# Patient Record
Sex: Male | Born: 1965 | Race: White | Hispanic: No | Marital: Married | Smoking: Never smoker
Health system: Southern US, Community
[De-identification: ages and names within clinical notes are randomized; demographics above are authoritative.]

## PROBLEM LIST (undated history)

## (undated) DIAGNOSIS — I10 Essential (primary) hypertension: Secondary | ICD-10-CM

## (undated) HISTORY — PX: OTHER SURGICAL HISTORY: SHX169

---

## 2008-07-15 ENCOUNTER — Ambulatory Visit: Payer: Self-pay | Admitting: Internal Medicine

## 2012-10-07 ENCOUNTER — Ambulatory Visit: Payer: Self-pay | Admitting: Family Medicine
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
## 2016-09-28 ENCOUNTER — Ambulatory Visit
Admission: EM | Admit: 2016-09-28 | Discharge: 2016-09-28 | Disposition: A | Discharge status: Home / Self Care | Payer: BLUE CROSS/BLUE SHIELD | Attending: Family Medicine | Admitting: Family Medicine

## 2016-09-28 DIAGNOSIS — H6502 Acute serous otitis media, left ear: Secondary | ICD-10-CM | POA: Diagnosis not present

## 2016-09-28 DIAGNOSIS — H9202 Otalgia, left ear: Secondary | ICD-10-CM | POA: Diagnosis not present

## 2016-09-28 HISTORY — DX: Essential (primary) hypertension: I10

## 2016-09-28 MED ORDER — AMOXICILLIN 875 MG PO TABS: 875.0000 mg | ORAL_TABLET | Freq: Two times a day (BID) | ORAL | 0 refills | Status: DC

## 2016-09-28 NOTE — ED Triage Notes (Signed)
As per patient Left side ear infection onset 3 days.

## 2016-09-28 NOTE — ED Provider Notes (Signed)
[facility]    CSN: [id number] Arrival date & time: 09/28/16  1208     History   Chief Complaint Chief Complaint  Patient presents with  . Otalgia    left side    HPI Ryan Chavez is a 51 y.o. male.    Otalgia  Location:  Left Quality:  Aching and pressure Severity:  Moderate Onset quality:  Sudden Duration:  3 days Timing:  Constant Progression:  Worsening Chronicity:  New Context: elevation change   Context: not direct blow, not foreign body in ear, not loud noise, not recent URI and not water in ear   Relieved by:  OTC medications Associated symptoms: no abdominal pain, no congestion, no cough, no diarrhea, no ear discharge, no fever, no headaches, no hearing loss, no neck pain, no rash, no rhinorrhea, no sore throat, no tinnitus and no vomiting   Risk factors: recent travel   Risk factors: no chronic ear infection and no prior ear surgery     Past Medical History:  Diagnosis Date  . Hypertension     There are no active problems to display for this patient.   Past Surgical History:  Procedure Laterality Date  .  fusion     C-5 and C-6       Home Medications    Prior to Admission medications   Medication Sig Start Date End Date Taking? Authorizing Provider  amLODipine (NORVASC) 5 MG tablet Take 5 mg by mouth daily.   Yes [provider]  amoxicillin (AMOXIL) 875 MG tablet Take 1 tablet (875 mg total) by mouth 2 (two) times daily. 09/28/16   Payton Mccallumonty, Ahmani Prehn, MD    Family History No family history on file.  Social History Social History  Substance Use Topics  . Smoking status: Never Smoker  . Smokeless tobacco: Never Used  . Alcohol use Yes     Allergies   Patient has no known allergies.   Review of Systems Review of Systems  Constitutional: Negative for fever.  HENT: Positive for ear pain. Negative for congestion, ear discharge, hearing loss, rhinorrhea, sore throat and tinnitus.   Respiratory: Negative for cough.     Gastrointestinal: Negative for abdominal pain, diarrhea and vomiting.  Musculoskeletal: Negative for neck pain.  Skin: Negative for rash.  Neurological: Negative for headaches.     Physical Exam Triage Vital Signs ED Triage Vitals  Enc Vitals Group     BP 09/28/16 1247 133/75     Pulse Rate 09/28/16 1247 61     Resp 09/28/16 1247 16     Temp 09/28/16 1247 98.1 F (36.7 C)     Temp Source 09/28/16 1247 Oral     SpO2 09/28/16 1247 99 %     Weight 09/28/16 1249 226 lb (102.5 kg)     Height 09/28/16 1249 5\' 11"  (1.803 m)     Head Circumference --      Peak Flow --      Pain Score 09/28/16 1244 0     Pain Loc --      Pain Edu? --      Excl. in GC? --    No data found.   Updated Vital Signs BP 133/75 (BP Location: Left Arm)   Pulse 61   Temp 98.1 F (36.7 C) (Oral)   Resp 16   Ht 5\' 11"  (1.803 m)   Wt 226 lb (102.5 kg)   SpO2 99%   BMI 31.52 kg/m   Visual Acuity Right  Eye Distance:   Left Eye Distance:   Bilateral Distance:    Right Eye Near:   Left Eye Near:    Bilateral Near:     Physical Exam  Constitutional: He appears well-developed and well-nourished. No distress.  HENT:  Right Ear: Tympanic membrane and ear canal normal.  Left Ear: Ear canal normal. Tympanic membrane is erythematous and bulging. A middle ear effusion is present.  Mouth/Throat: Oropharynx is clear and moist.  Skin: He is not diaphoretic.  Nursing note and vitals reviewed.    UC Treatments / Results  Labs (all labs ordered are listed, but only abnormal results are displayed) Labs Reviewed - No data to display  EKG  EKG Interpretation None       Radiology No results found.  Procedures Procedures (including critical care time)  Medications Ordered in UC Medications - No data to display   Initial Impression / Assessment and Plan / UC Course  I have reviewed the triage vital signs and the nursing notes.  Pertinent labs & imaging results that were available during my  care of the patient were reviewed by me and considered in my medical decision making (see chart for details).       Final Clinical Impressions(s) / UC Diagnoses   Final diagnoses:  Acute serous otitis media of left ear, recurrence not specified    New Prescriptions Discharge Medication List as of 09/28/2016  1:01 PM    START taking these medications   Details  amoxicillin (AMOXIL) 875 MG tablet Take 1 tablet (875 mg total) by mouth 2 (two) times daily., Starting Sat 09/28/2016, Normal       1. diagnosis reviewed with patient 2. rx as per orders above; reviewed possible side effects, interactions, risks and benefits  3. Recommend supportive treatment with otc analgesics prn 4. Follow-up prn if symptoms worsen or don't improve   Payton Mccallumonty, Salote Weidmann, MD 09/28/16 1336

## 2016-10-03 ENCOUNTER — Telehealth: Payer: Self-pay

## 2016-10-03 NOTE — Telephone Encounter (Signed)
Pt called and mentioned he wasn't getting any real relief with Amox that is treating an ear infection. This is day 5 of the medication. I encouraged patient to continue medication for the full 10 days, however if the pain intensifies, drainage from the ear, or any other concerns he should be seen by a provider.

## 2016-10-16 ENCOUNTER — Ambulatory Visit
Admission: EM | Admit: 2016-10-16 | Discharge: 2016-10-16 | Disposition: A | Discharge status: Home / Self Care | Payer: BLUE CROSS/BLUE SHIELD | Attending: Family Medicine | Admitting: Family Medicine

## 2016-10-16 DIAGNOSIS — H6692 Otitis media, unspecified, left ear: Secondary | ICD-10-CM | POA: Diagnosis not present

## 2016-10-16 DIAGNOSIS — H60502 Unspecified acute noninfective otitis externa, left ear: Secondary | ICD-10-CM

## 2016-10-16 MED ORDER — CEFDINIR 300 MG PO CAPS: 300.0000 mg | ORAL_CAPSULE | Freq: Two times a day (BID) | ORAL | 0 refills | Status: DC

## 2016-10-16 MED ORDER — CIPROFLOXACIN-DEXAMETHASONE 0.3-0.1 % OT SUSP: 4.0000 [drp] | Freq: Two times a day (BID) | OTIC | 0 refills | Status: AC

## 2016-10-16 NOTE — ED Provider Notes (Signed)
[facility] ____________________________________________  Time seen: Approximately 9:56 AM  I have reviewed the triage vital signs and the nursing notes.   HISTORY  Chief Complaint Otalgia (left)   HPI Ryan Chavez is a 51 y.o. male  present for evaluation of left ear pain medicine present for approximately 3 weeks. Patient reports that he was seen in urgent care earlier August and was diagnosed with left otitis media and was treated with oral amoxicillin. States after completion of the antibiotic the pain has continued. Patient reports slight muffled in hearing, denies drainage or tinnitus. Denies any fall, injury, trauma. Denies history of similar in the past. Denies recurrent ear issues, sinus issues or recent sickness otherwise. States no associated cough, congestion and fevers. States mild pain at this time, occasionally pain increases. No over-the-counter medications taken for the same complaints. Denies known triggers. Reports otherwise feels well. Does not grind teeth at night.  Denies chest pain, shortness of breath, abdominal pain, paresthesias, facial changes, or rash. Denies recent sickness. Denies other recent antibiotic use. Denies cardiac history. Denies renal insufficiency.   Past Medical History:  Diagnosis Date  . Hypertension     There are no active problems to display for this patient.   Past Surgical History:  Procedure Laterality Date  .  fusion     C-5 and C-6     No current facility-administered medications for this encounter.   Current Outpatient Prescriptions:  .  amLODipine (NORVASC) 5 MG tablet, Take 5 mg by mouth daily., Disp: , Rfl:  .  amoxicillin (AMOXIL) 875 MG tablet, Take 1 tablet (875 mg total) by mouth 2 (two) times daily., Disp: 20 tablet, Rfl: 0 .  cefdinir (OMNICEF) 300 MG capsule, Take 1 capsule (300 mg total) by mouth 2 (two) times daily., Disp: 20 capsule, Rfl: 0 .  ciprofloxacin-dexamethasone (CIPRODEX) OTIC  suspension, Place 4 drops into the left ear 2 (two) times daily., Disp: 7.5 mL, Rfl: 0  Allergies Patient has no known allergies.  History reviewed. No pertinent family history.  Social History Social History  Substance Use Topics  . Smoking status: Never Smoker  . Smokeless tobacco: Never Used  . Alcohol use Yes    Review of Systems Constitutional: No fever/chills Eyes: No visual changes. ENT: No sore throat. As above.  Cardiovascular: Denies chest pain. Respiratory: Denies shortness of breath. Gastrointestinal: No abdominal pain.   Musculoskeletal: Negative for back pain. Recent cervical fusion, reports healing well without complications or complaints.  Skin: Negative for rash. Neurological: Negative for headaches, focal weakness or numbness.   ____________________________________________   PHYSICAL EXAM:  VITAL SIGNS: ED Triage Vitals  Enc Vitals Group     BP 10/16/16 0905 (!) 151/88     Pulse Rate 10/16/16 0905 69     Resp 10/16/16 0905 18     Temp 10/16/16 0905 98.2 F (36.8 C)     Temp Source 10/16/16 0905 Oral     SpO2 10/16/16 0905 100 %     Weight 10/16/16 0904 226 lb (102.5 kg)     Height 10/16/16 0904 5\' 11"  (1.803 m)     Head Circumference --      Peak Flow --      Pain Score 10/16/16 0905 0     Pain Loc --      Pain Edu? --      Excl. in GC? --     Constitutional: Alert and oriented. Well appearing and in no acute distress. Eyes: Conjunctivae are  normal.  Head: Atraumatic. No sinus tenderness to palpation. No swelling. No erythema.  Ears: Left: nontender with auricle movement, mild to moderate canal swelling and erythema without exudate, mild to moderate TM erythema, dull TM, no swelling or exudate, TM appears intact. Right: nontender, no erythema, no drainage, normal TM. No surrounding tenderness bilaterally.   Nose:No nasal congestion or rhinorrhea.   Mouth/Throat: Mucous membranes are moist. No pharyngeal erythema. No tonsillar swelling or  exudate.  Neck: No stridor.  No cervical spine tenderness to palpation. Hematological/Lymphatic/Immunilogical: No cervical lymphadenopathy. Cardiovascular: Normal rate, regular rhythm. Grossly normal heart sounds.  Good peripheral circulation. Respiratory: Normal respiratory effort.  No retractions. No wheezes, rales or rhonchi. Good air movement.  Musculoskeletal: Ambulatory with steady gait.  Neurologic:  Normal speech and language. No gait instability. Skin:  Skin appears warm, dry Psychiatric: Mood and affect are normal. Speech and behavior are normal.  ___________________________________________   LABS (all labs ordered are listed, but only abnormal results are displayed)  Labs Reviewed - No data to display  PROCEDURES Procedures   INITIAL IMPRESSION / ASSESSMENT AND PLAN / ED COURSE  Pertinent labs & imaging results that were available during my care of the patient were reviewed by me and considered in my medical decision making (see chart for details).  Well appearing patient, no acute distress. Left otitis media recurrent, and left otitis externa. Will treat with oral cefdinir and ciprodex otic drops. Encouraged keeping dry, monitoring and supportive care. Discussed indication, risks and benefits of medications with patient.  Discussed follow up with Primary care physician this week. Discussed follow up and return parameters including no resolution or any worsening concerns. Patient verbalized understanding and agreed to plan.   ____________________________________________   FINAL CLINICAL IMPRESSION(S) / ED DIAGNOSES  Final diagnoses:  Left otitis media, unspecified otitis media type  Acute otitis externa of left ear, unspecified type     Discharge Medication List as of 10/16/2016  9:49 AM    START taking these medications   Details  cefdinir (OMNICEF) 300 MG capsule Take 1 capsule (300 mg total) by mouth 2 (two) times daily., Starting Wed 10/16/2016, Normal      ciprofloxacin-dexamethasone (CIPRODEX) OTIC suspension Place 4 drops into the left ear 2 (two) times daily., Starting Wed 10/16/2016, Until Wed 10/23/2016, Normal        Note: This dictation was prepared with Dragon dictation along with smaller phrase technology. Any transcriptional errors that result from this process are unintentional.         Renford Dills, NP 10/16/16 1118

## 2016-10-16 NOTE — Discharge Instructions (Signed)
Take medication as prescribed. Drink plenty of fluids.  ° °Follow up with your primary care physician this week as needed. Return to Urgent care for new or worsening concerns.  ° °

## 2016-10-16 NOTE — ED Triage Notes (Signed)
Patient complains of left ear pain x 3 weeks. Patient states that he was seen here on August 4th and was given amoxicillin. Patient states that he noticed little relief after the 10 day course and symptoms worsened again.

## 2022-11-11 ENCOUNTER — Ambulatory Visit
Admission: EM | Admit: 2022-11-11 | Discharge: 2022-11-11 | Disposition: A | Discharge status: Home / Self Care | Payer: Managed Care, Other (non HMO) | Attending: Family Medicine | Admitting: Family Medicine

## 2022-11-11 ENCOUNTER — Encounter: Payer: Self-pay | Admitting: Emergency Medicine

## 2022-11-11 DIAGNOSIS — R21 Rash and other nonspecific skin eruption: Secondary | ICD-10-CM

## 2022-11-11 DIAGNOSIS — L03115 Cellulitis of right lower limb: Secondary | ICD-10-CM | POA: Diagnosis not present

## 2022-11-11 MED ORDER — TRIAMCINOLONE ACETONIDE 0.1 % EX OINT: 1.0000 | TOPICAL_OINTMENT | Freq: Two times a day (BID) | CUTANEOUS | 0 refills | Status: AC

## 2022-11-11 MED ORDER — DOXYCYCLINE HYCLATE 100 MG PO CAPS: 100.0000 mg | ORAL_CAPSULE | Freq: Two times a day (BID) | ORAL | 0 refills | Status: AC

## 2022-11-11 NOTE — ED Triage Notes (Signed)
Pt presents with a ?insect bite in his right groin area x 4-5 days. Pt states the area is red and spreading.

## 2022-11-11 NOTE — ED Provider Notes (Signed)
[facility]    CSN: [id number] Arrival date & time: 11/11/22  1508      History   Chief Complaint Chief Complaint  Patient presents with   Insect Bite    HPI Ryan Chavez is a 57 y.o. male.   HPI  Ryan Chavez presents for right groin rash that started 4 to 5 days ago.  Believes he was bitten by something.  Denies single bladder refill antibiotic.  Has grown in size but not much in the last 2 days.  The area is red and itching but not painful.  It is swollen and warm.  Put some cortisone cream onto and Benadryl for itching.  He has been outside mowing the grass.  No new medications, soaps or foods.  Denies fever, chills, nausea, vomiting, diarrhea.         Past Medical History:  Diagnosis Date   Hypertension     There are no problems to display for this patient.   Past Surgical History:  Procedure Laterality Date    fusion     C-5 and C-6       Home Medications    Prior to Admission medications   Medication Sig Start Date End Date Taking? Authorizing Provider  amLODipine (NORVASC) 10 MG tablet Take 10 mg by mouth daily. 09/02/22  Yes [provider]  doxycycline (VIBRAMYCIN) 100 MG capsule Take 1 capsule (100 mg total) by mouth 2 (two) times daily. 11/11/22  Yes Taahir Grisby, DO  triamcinolone ointment (KENALOG) 0.1 % Apply 1 Application topically 2 (two) times daily. 11/11/22  Yes Katha Cabal, DO    Family History History reviewed. No pertinent family history.  Social History Social History   Tobacco Use   Smoking status: Never   Smokeless tobacco: Never  Vaping Use   Vaping status: Never Used  Substance Use Topics   Alcohol use: Yes   Drug use: No     Allergies   Patient has no known allergies.   Review of Systems Review of Systems :negative unless otherwise stated in HPI.      Physical Exam Triage Vital Signs ED Triage Vitals  Encounter Vitals Group     BP 11/11/22 1558 (!) 158/89     Systolic BP Percentile --       Diastolic BP Percentile --      Pulse Rate 11/11/22 1558 69     Resp 11/11/22 1558 16     Temp 11/11/22 1558 99 F (37.2 C)     Temp Source 11/11/22 1558 Oral     SpO2 11/11/22 1558 96 %     Weight --      Height --      Head Circumference --      Peak Flow --      Pain Score 11/11/22 1556 0     Pain Loc --      Pain Education --      Exclude from Growth Chart --    No data found.  Updated Vital Signs BP (!) 158/89 (BP Location: Right Arm)   Pulse 69   Temp 99 F (37.2 C) (Oral)   Resp 16   SpO2 96%   Visual Acuity Right Eye Distance:   Left Eye Distance:   Bilateral Distance:    Right Eye Near:   Left Eye Near:    Bilateral Near:     Physical Exam  GEN: alert, well appearing male, in no acute distress  EYES: no scleral injection  CV: regular rate, brisk cap refill  RESP: no increased work of breathing MSK: no extremity edema, normal ROM  NEURO: alert, moves all extremities appropriately,  oriented  SKIN: warm and dry; erythematous macular patch with surround erythema and central desquamated lesion, no insect parts visible      UC Treatments / Results  Labs (all labs ordered are listed, but only abnormal results are displayed) Labs Reviewed - No data to display  EKG   Radiology No results found.  Procedures Procedures (including critical care time)  Medications Ordered in UC Medications - No data to display  Initial Impression / Assessment and Plan / UC Course  I have reviewed the triage vital signs and the nursing notes.  Pertinent labs & imaging results that were available during my care of the patient were reviewed by me and considered in my medical decision making (see chart for details).     Patient is a 57 y.o. malewho presents for rash on right thigh/groin.  Overall, patient is well-appearing and well-hydrated.  Vital signs stable.  Ryan Chavez is afebrile.  Exam concerning for cellulitis vs 2/2 reaction from insect bite.  Treat with  doxycycline and steroid ointment.  Reviewed expectations regarding course of current medical issues.  All questions asked were answered.  Outlined signs and symptoms indicating need for more acute intervention. Patient verbalized understanding. After Visit Summary given.   Final Clinical Impressions(s) / UC Diagnoses   Final diagnoses:  Rash     Discharge Instructions      Stop by the pharmacy to pick up your prescriptions.  Follow up with your primary care provider as needed.  After 48 hours if your redness spreads across the line or suddenly shoots down your leg or up to your belly, go to the emergency department for IV antibiotics.     ED Prescriptions     Medication Sig Dispense Auth. Provider   doxycycline (VIBRAMYCIN) 100 MG capsule Take 1 capsule (100 mg total) by mouth 2 (two) times daily. 20 capsule Taziah Difatta, DO   triamcinolone ointment (KENALOG) 0.1 % Apply 1 Application topically 2 (two) times daily. 30 g Katha Cabal, DO      PDMP not reviewed this encounter.              Katha Cabal, DO 11/11/22 1711

## 2022-11-11 NOTE — Discharge Instructions (Signed)
Stop by the pharmacy to pick up your prescriptions.  Follow up with your primary care provider as needed.  After 48 hours if your redness spreads across the line or suddenly shoots down your leg or up to your belly, go to the emergency department for IV antibiotics.
